# Patient Record
Sex: Female | Born: 1970 | Race: White | Hispanic: No | Smoking: Never smoker
Health system: Southern US, Community
[De-identification: ages and names within clinical notes are randomized; demographics above are authoritative.]

## PROBLEM LIST (undated history)

## (undated) DIAGNOSIS — R519 Headache, unspecified: Secondary | ICD-10-CM

## (undated) DIAGNOSIS — T7840XA Allergy, unspecified, initial encounter: Secondary | ICD-10-CM

## (undated) DIAGNOSIS — G43909 Migraine, unspecified, not intractable, without status migrainosus: Secondary | ICD-10-CM

## (undated) DIAGNOSIS — K5140 Inflammatory polyps of colon without complications: Secondary | ICD-10-CM

## (undated) HISTORY — DX: Allergy, unspecified, initial encounter: T78.40XA

## (undated) HISTORY — PX: OTHER SURGICAL HISTORY: SHX169

## (undated) HISTORY — DX: Headache, unspecified: R51.9

## (undated) HISTORY — DX: Inflammatory polyps of colon without complications: K51.40

## (undated) HISTORY — DX: Migraine, unspecified, not intractable, without status migrainosus: G43.909

---

## 1998-04-16 ENCOUNTER — Other Ambulatory Visit: Admission: RE | Admit: 1998-04-16 | Discharge: 1998-04-16 | Payer: Self-pay | Admitting: Obstetrics and Gynecology

## 1998-12-19 ENCOUNTER — Other Ambulatory Visit: Admission: RE | Admit: 1998-12-19 | Discharge: 1998-12-19 | Payer: Self-pay | Admitting: Obstetrics and Gynecology

## 1999-05-20 ENCOUNTER — Inpatient Hospital Stay (HOSPITAL_COMMUNITY): Admission: AD | Admit: 1999-05-20 | Discharge: 1999-05-20 | Payer: Self-pay | Admitting: Obstetrics and Gynecology

## 1999-07-05 ENCOUNTER — Encounter (INDEPENDENT_AMBULATORY_CARE_PROVIDER_SITE_OTHER): Payer: Self-pay | Admitting: Specialist

## 1999-07-05 ENCOUNTER — Inpatient Hospital Stay (HOSPITAL_COMMUNITY): Admission: AD | Admit: 1999-07-05 | Discharge: 1999-07-07 | Payer: Self-pay | Admitting: Obstetrics and Gynecology

## 1999-08-15 ENCOUNTER — Other Ambulatory Visit: Admission: RE | Admit: 1999-08-15 | Discharge: 1999-08-15 | Payer: Self-pay | Admitting: Obstetrics and Gynecology

## 2000-09-03 ENCOUNTER — Other Ambulatory Visit: Admission: RE | Admit: 2000-09-03 | Discharge: 2000-09-03 | Payer: Self-pay | Admitting: Obstetrics and Gynecology

## 2001-09-16 ENCOUNTER — Other Ambulatory Visit: Admission: RE | Admit: 2001-09-16 | Discharge: 2001-09-16 | Payer: Self-pay | Admitting: Obstetrics and Gynecology

## 2002-09-20 ENCOUNTER — Other Ambulatory Visit: Admission: RE | Admit: 2002-09-20 | Discharge: 2002-09-20 | Payer: Self-pay | Admitting: Obstetrics and Gynecology

## 2002-09-27 ENCOUNTER — Encounter: Payer: Self-pay | Admitting: Obstetrics and Gynecology

## 2002-09-27 ENCOUNTER — Encounter: Admission: RE | Admit: 2002-09-27 | Discharge: 2002-09-27 | Payer: Self-pay | Admitting: Obstetrics and Gynecology

## 2003-10-25 ENCOUNTER — Other Ambulatory Visit: Admission: RE | Admit: 2003-10-25 | Discharge: 2003-10-25 | Payer: Self-pay | Admitting: Obstetrics and Gynecology

## 2004-11-19 ENCOUNTER — Other Ambulatory Visit: Admission: RE | Admit: 2004-11-19 | Discharge: 2004-11-19 | Payer: Self-pay | Admitting: Obstetrics and Gynecology

## 2004-12-04 ENCOUNTER — Encounter: Admission: RE | Admit: 2004-12-04 | Discharge: 2004-12-04 | Payer: Self-pay | Admitting: Obstetrics and Gynecology

## 2008-07-12 ENCOUNTER — Encounter: Admission: RE | Admit: 2008-07-12 | Discharge: 2008-07-12 | Payer: Self-pay | Admitting: Obstetrics and Gynecology

## 2010-08-29 ENCOUNTER — Other Ambulatory Visit: Payer: Self-pay | Admitting: Obstetrics and Gynecology

## 2010-08-29 DIAGNOSIS — R928 Other abnormal and inconclusive findings on diagnostic imaging of breast: Secondary | ICD-10-CM

## 2010-09-05 ENCOUNTER — Ambulatory Visit
Admission: RE | Admit: 2010-09-05 | Discharge: 2010-09-05 | Disposition: A | Discharge status: Home / Self Care | Payer: BC Managed Care – PPO | Source: Ambulatory Visit | Attending: Obstetrics and Gynecology | Admitting: Obstetrics and Gynecology

## 2010-09-05 DIAGNOSIS — R928 Other abnormal and inconclusive findings on diagnostic imaging of breast: Secondary | ICD-10-CM

## 2014-09-17 ENCOUNTER — Encounter: Payer: Self-pay | Admitting: *Deleted

## 2014-09-17 ENCOUNTER — Other Ambulatory Visit: Payer: Self-pay

## 2014-09-17 ENCOUNTER — Emergency Department
Admission: EM | Admit: 2014-09-17 | Discharge: 2014-09-17 | Disposition: A | Discharge status: Home / Self Care | Payer: BC Managed Care – PPO | Attending: Emergency Medicine | Admitting: Emergency Medicine

## 2014-09-17 ENCOUNTER — Emergency Department: Payer: BC Managed Care – PPO

## 2014-09-17 DIAGNOSIS — R202 Paresthesia of skin: Secondary | ICD-10-CM | POA: Diagnosis not present

## 2014-09-17 DIAGNOSIS — G43909 Migraine, unspecified, not intractable, without status migrainosus: Secondary | ICD-10-CM | POA: Diagnosis not present

## 2014-09-17 DIAGNOSIS — Z3202 Encounter for pregnancy test, result negative: Secondary | ICD-10-CM | POA: Insufficient documentation

## 2014-09-17 DIAGNOSIS — R002 Palpitations: Secondary | ICD-10-CM

## 2014-09-17 DIAGNOSIS — R0789 Other chest pain: Secondary | ICD-10-CM | POA: Diagnosis not present

## 2014-09-17 LAB — BASIC METABOLIC PANEL
ANION GAP: 12 (ref 5–15)
BUN: 9 mg/dL (ref 6–20)
CHLORIDE: 96 mmol/L — AB (ref 101–111)
CO2: 23 mmol/L (ref 22–32)
CREATININE: 0.73 mg/dL (ref 0.44–1.00)
Calcium: 9.4 mg/dL (ref 8.9–10.3)
GLUCOSE: 116 mg/dL — AB (ref 65–99)
Potassium: 3.4 mmol/L — ABNORMAL LOW (ref 3.5–5.1)
SODIUM: 131 mmol/L — AB (ref 135–145)

## 2014-09-17 LAB — CBC
HCT: 41.4 % (ref 35.0–47.0)
Hemoglobin: 14.1 g/dL (ref 12.0–16.0)
MCH: 30.3 pg (ref 26.0–34.0)
MCHC: 34.1 g/dL (ref 32.0–36.0)
MCV: 89.1 fL (ref 80.0–100.0)
PLATELETS: 308 10*3/uL (ref 150–440)
RBC: 4.65 MIL/uL (ref 3.80–5.20)
RDW: 12.7 % (ref 11.5–14.5)
WBC: 11.2 10*3/uL — ABNORMAL HIGH (ref 3.6–11.0)

## 2014-09-17 LAB — POCT PREGNANCY, URINE: PREG TEST UR: NEGATIVE

## 2014-09-17 LAB — TROPONIN I

## 2014-09-17 NOTE — ED Notes (Signed)
Pt states that she has been having chest pressure, tightness and right sided arm pain. Pt states that she also has a headache and is feeling very anxious.  Pt states that she worked outside and that she "did over do it" .  Pt states that she drank one beer today. Pt in NAD a this time, pt was ambulatory to treatment room and has stable vitals.

## 2014-09-17 NOTE — ED Provider Notes (Signed)
[facility] Emergency Department Provider Note  Time seen: 10:58 PM  I have reviewed the triage vital signs and the nursing notes.   HISTORY  Chief Complaint Palpitations    HPI Hailey Williams is a 44 y.o. female with a past medical history of migraines presents the emergency department with symptoms of fatigue, weakness, migraine headache, chest tightness, palpitations, arm tingling. According to the patient she states she was doing a lot of yard work today in the heat. Since around 2-3 PM the patient has been feeling weak, like she "overdid it". Around dinnertime tonight the patient was feeling palpitations, developed a migraine headache with nausea, one episode of vomiting, and felt some chest tightness. She states after the episode of vomiting she felt considerably better but the headache continued. The patient states she gets migraine headaches very frequently which she takes medicine at home for. Patient denies any chest pain currently upon arrival to the ER. She does state tingling in the right arm and left hand. States continued migraine which she ranks a 7/10. Along with photophobia/phonophobia.She states this feels like her typical migraine.     No past medical history on file.  There are no active problems to display for this patient.   No past surgical history on file.  No current outpatient prescriptions on file.  Allergies Review of patient's allergies indicates no known allergies.  No family history on file.  Social History History  Substance Use Topics  . Smoking status: Not on file  . Smokeless tobacco: Not on file  . Alcohol Use: Not on file    Review of Systems Constitutional: Negative for fever. Eyes: Positive photophobia Cardiovascular: Positive chest tightness, now resolved Respiratory: Mild shortness of breath, now resolved Gastrointestinal: Negative for abdominal pain. One episode of nausea/vomiting, now  resolved Genitourinary: Negative for dysuria. Skin: Negative for rash. Neurological: Positive for migraine headache. Bilateral hand tingling with some right arm tingling/numbness. 10-point ROS otherwise negative.  ____________________________________________   PHYSICAL EXAM:  VITAL SIGNS: ED Triage Vitals  Enc Vitals Group     BP 09/17/14 2117 148/100 mmHg     Pulse Rate 09/17/14 2117 105     Resp 09/17/14 2117 18     Temp 09/17/14 2117 97.9 F (36.6 C)     Temp Source 09/17/14 2117 Oral     SpO2 09/17/14 2117 100 %     Weight 09/17/14 2117 175 lb (79.379 kg)     Height 09/17/14 2117  (1.753 m)     Head Cir --      Peak Flow --      Pain Score 09/17/14 2249 7     Pain Loc --      Pain Edu? --      Excl. in GC? --     Constitutional: Alert and oriented. Laying in bed with her eyes closed, saying she has a migraine headache. Eyes: Normal exam, 3 mm PERRL, moderate photophobia ENT   Head: Normocephalic and atraumatic.   Mouth/Throat: Mucous membranes are moist. Cardiovascular: Normal rate, regular rhythm. No murmurs, no ectopic beats. Respiratory: Normal respiratory effort without tachypnea nor retractions. Breath sounds are clear Gastrointestinal: Soft and nontender. No distention.   Musculoskeletal: Nontender with normal range of motion in all extremities. N Neurologic:  Normal speech and language. No gross focal neurologic deficits are appreciated. Speech is normal. Skin:  Skin is warm, dry and intact.  Psychiatric: Mood and affect are normal. Speech and behavior are normal.  ____________________________________________    EKG  EKG reviewed and interpreted by myself shows sinus tachycardia 101 bpm, narrow QRS, normal axis, normal intervals. No ST changes noted.  ____________________________________________  INITIAL IMPRESSION / ASSESSMENT AND PLAN / ED COURSE  Pertinent labs & imaging results that were available during my care of the patient were  reviewed by me and considered in my medical decision making (see chart for details).  Patient with vague symptoms, but most importantly with a headache and some right arm and left hand tingling, and palpitations/chest pain which is now resolved. This is labs show largely normal results at this time. EKG is normal. I discussed with the patient my plan to treat her migraine, monitor in the emergency department and repeat a troponin on the patient to ensure that her heart is okay. Patient states she feels like a normal migraine at this time, she really wishes to go home and try to sleep off the migraine. I discussed with the patient very strict return precautions, which the patient is agreeable and we will discharge her home.  ____________________________________________   FINAL CLINICAL IMPRESSION(S) / ED DIAGNOSES  Migraine headache Palpitations   Minna AntisKevin Doroteo Nickolson, MD 09/17/14 [phone]

## 2014-09-17 NOTE — ED Notes (Signed)
Neg preg test

## 2014-09-17 NOTE — Discharge Instructions (Signed)
Migraine Headache  A migraine headache is an intense, throbbing pain on one or both sides of your head. A migraine can last for 30 minutes to several hours.  CAUSES   The exact cause of a migraine headache is not always known. However, a migraine may be caused when nerves in the brain become irritated and release chemicals that cause inflammation. This causes pain.  Certain things may also trigger migraines, such as:   Alcohol.   Smoking.   Stress.   Menstruation.   Aged cheeses.   Foods or drinks that contain nitrates, glutamate, aspartame, or tyramine.   Lack of sleep.   Chocolate.   Caffeine.   Hunger.   Physical exertion.   Fatigue.   Medicines used to treat chest pain (nitroglycerine), birth control pills, estrogen, and some blood pressure medicines.  SIGNS AND SYMPTOMS   Pain on one or both sides of your head.   Pulsating or throbbing pain.   Severe pain that prevents daily activities.   Pain that is aggravated by any physical activity.   Nausea, vomiting, or both.   Dizziness.   Pain with exposure to bright lights, loud noises, or activity.   General sensitivity to bright lights, loud noises, or smells.  Before you get a migraine, you may get warning signs that a migraine is coming (aura). An aura may include:   Seeing flashing lights.   Seeing bright spots, halos, or zigzag lines.   Having tunnel vision or blurred vision.   Having feelings of numbness or tingling.   Having trouble talking.   Having muscle weakness.  DIAGNOSIS   A migraine headache is often diagnosed based on:   Symptoms.   Physical exam.   A CT scan or MRI of your head. These imaging tests cannot diagnose migraines, but they can help rule out other causes of headaches.  TREATMENT  Medicines may be given for pain and nausea. Medicines can also be given to help prevent recurrent migraines.   HOME CARE INSTRUCTIONS   Only take over-the-counter or prescription medicines for pain or discomfort as directed by your  health care provider. The use of long-term narcotics is not recommended.   Lie down in a dark, quiet room when you have a migraine.   Keep a journal to find out what may trigger your migraine headaches. For example, write down:   What you eat and drink.   How much sleep you get.   Any change to your diet or medicines.   Limit alcohol consumption.   Quit smoking if you smoke.   Get 7-9 hours of sleep, or as recommended by your health care provider.   Limit stress.   Keep lights dim if bright lights bother you and make your migraines worse.  SEEK IMMEDIATE MEDICAL CARE IF:    Your migraine becomes severe.   You have a fever.   You have a stiff neck.   You have vision loss.   You have muscular weakness or loss of muscle control.   You start losing your balance or have trouble walking.   You feel faint or pass out.   You have severe symptoms that are different from your first symptoms.  MAKE SURE YOU:    Understand these instructions.   Will watch your condition.   Will get help right away if you are not doing well or get worse.  Document Released: 04/06/2005 Document Revised: 08/21/2013 Document Reviewed: 12/12/2012  ExitCare Patient Information 2015 [employer]. This information   is not intended to replace advice given to you by your health care provider. Make sure you discuss any questions you have with your health care provider.    Palpitations  A palpitation is the feeling that your heartbeat is irregular or is faster than normal. It may feel like your heart is fluttering or skipping a beat. Palpitations are usually not a serious problem. However, in some cases, you may need further medical evaluation.  CAUSES   Palpitations can be caused by:   Smoking.   Caffeine or other stimulants, such as diet pills or energy drinks.   Alcohol.   Stress and anxiety.   Strenuous physical activity.   Fatigue.   Certain medicines.   Heart disease, especially if you have a history of irregular heart  rhythms (arrhythmias), such as atrial fibrillation, atrial flutter, or supraventricular tachycardia.   An improperly working pacemaker or defibrillator.  DIAGNOSIS   To find the cause of your palpitations, your health care provider will take your medical history and perform a physical exam. Your health care provider may also have you take a test called an ambulatory electrocardiogram (ECG). An ECG records your heartbeat patterns over a 24-hour period. You may also have other tests, such as:   Transthoracic echocardiogram (TTE). During echocardiography, sound waves are used to evaluate how blood flows through your heart.   Transesophageal echocardiogram (TEE).   Cardiac monitoring. This allows your health care provider to monitor your heart rate and rhythm in real time.   Holter monitor. This is a portable device that records your heartbeat and can help diagnose heart arrhythmias. It allows your health care provider to track your heart activity for several days, if needed.   Stress tests by exercise or by giving medicine that makes the heart beat faster.  TREATMENT   Treatment of palpitations depends on the cause of your symptoms and can vary greatly. Most cases of palpitations do not require any treatment other than time, relaxation, and monitoring your symptoms. Other causes, such as atrial fibrillation, atrial flutter, or supraventricular tachycardia, usually require further treatment.  HOME CARE INSTRUCTIONS    Avoid:   Caffeinated coffee, tea, soft drinks, diet pills, and energy drinks.   Chocolate.   Alcohol.   Stop smoking if you smoke.   Reduce your stress and anxiety. Things that can help you relax include:   A method of controlling things in your body, such as your heartbeats, with your mind (biofeedback).   Yoga.   Meditation.   Physical activity such as swimming, jogging, or walking.   Get plenty of rest and sleep.  SEEK MEDICAL CARE IF:    You continue to have a fast or irregular  heartbeat beyond 24 hours.   Your palpitations occur more often.  SEEK IMMEDIATE MEDICAL CARE IF:   You have chest pain or shortness of breath.   You have a severe headache.   You feel dizzy or you faint.  MAKE SURE YOU:   Understand these instructions.   Will watch your condition.   Will get help right away if you are not doing well or get worse.  Document Released: 04/03/2000 Document Revised: 04/11/2013 Document Reviewed: 06/05/2011  ExitCare Patient Information 2015 [employer]. This information is not intended to replace advice given to you by your health care provider. Make sure you discuss any questions you have with your health care provider.

## 2014-09-17 NOTE — ED Notes (Signed)
Pt states that she took 81mg  ASA PTA

## 2018-06-10 ENCOUNTER — Ambulatory Visit: Payer: Self-pay | Admitting: Physician Assistant

## 2018-06-10 VITALS — BP 138/90 | HR 84 | Temp 98.8°F | Resp 16 | Wt 193.0 lb

## 2018-06-10 DIAGNOSIS — J22 Unspecified acute lower respiratory infection: Secondary | ICD-10-CM

## 2018-06-10 DIAGNOSIS — R0981 Nasal congestion: Secondary | ICD-10-CM

## 2018-06-10 DIAGNOSIS — R0982 Postnasal drip: Secondary | ICD-10-CM

## 2018-06-10 MED ORDER — PREDNISONE 50 MG PO TABS: 50.0000 mg | ORAL_TABLET | Freq: Every day | ORAL | 0 refills | Status: AC

## 2018-06-10 MED ORDER — PSEUDOEPH-BROMPHEN-DM 30-2-10 MG/5ML PO SYRP: 5.0000 mL | ORAL_SOLUTION | Freq: Four times a day (QID) | ORAL | 0 refills | Status: DC | PRN

## 2018-06-10 MED ORDER — DOXYCYCLINE HYCLATE 100 MG PO TABS: 100.0000 mg | ORAL_TABLET | Freq: Two times a day (BID) | ORAL | 0 refills | Status: AC

## 2018-06-10 NOTE — Progress Notes (Signed)
Patient ID: Hailey Williams DOB: 05-18-1970 AGE: 48 y.o. MRN: [id number]   PCP: No primary care provider on file.   Chief Complaint:  Chief Complaint  Patient presents with  . Nasal Congestion    x1wk  . Hoarse    x1wk  . Fever    on Sun & mon     Subjective:    HPI:  STEPHENIE Williams is a 48 y.o. female presents for evaluation  Chief Complaint  Patient presents with  . Nasal Congestion    x1wk  . Hoarse    x1wk  . Fever    on Sun & mon   48 year old female presents to [facility] with 8 day history of URI symptoms. Began with body aches and sore throat. Then developed hoarse voice/laryngitis, rhinorrhea, nasal congestion, postnasal drip and cough. Day 2-4 of symptoms had fever; tmax 101F, controlled with ibuprofen. Felt better for 1-2 days. Then developed worsening cough. Coarse, junky, barking. Causing difficulty sleeping. Triggered a migraine; patient with diagnosis of migraines, managed by Ob/Gyn, has Relpax prn, took one Relpax yesterday evening. Reports return of fatigue and body aches. Has taken OTC Mucinex, Dayquil, and Nyquil with minimal relief. Using Afrin nasal spray. Patient on Zyrtec daily for past 6 weeks due to seasonal allergies; most severe in Spring/Summer. Denies dizziness/lightheadedness, ear pain, sinus pain, chest pain, SOB, wheezing, nausea/vomiting (though, does have anorexia), diarrhea, rash.  Patient did receive this season's influenza vaccination. No known specific flu exposure. Patient's father recently died; planning wake and funeral for this weekend, patient admits to being stressed and not sleeping well.  Denies smoking history. No asthma history. In 2010, ten years ago, patient had influenza that then turned in to severe bronchitis. Was treated empirically for CAP, no CXR performed.  A limited review of symptoms was performed, pertinent positives and negatives as mentioned in HPI.  The following portions of the patient's history  were reviewed and updated as appropriate: allergies, current medications and past medical history.  There are no active problems to display for this patient.   No Known Allergies  Current Outpatient Medications on File Prior to Visit  Medication Sig Dispense Refill  . CRYSELLE-28 0.3-30 MG-MCG tablet     . eletriptan (RELPAX) 40 MG tablet      No current facility-administered medications on file prior to visit.        Objective:   Vitals:   06/10/18 0823  BP: 138/90  Pulse: 84  Resp: 16  Temp: 98.8 F (37.1 C)  SpO2: 95%     Wt Readings from Last 3 Encounters:  06/10/18 193 lb (87.5 kg)  09/17/14 175 lb (79.4 kg)    Physical Exam:   General Appearance:  Patient sitting comfortably on examination table. Conversational. Peri [place] self-historian. In no acute distress. Afebrile.   Head:  Normocephalic, without obvious abnormality, atraumatic  Eyes:  PERRL, conjunctiva/corneas clear, EOM's intact  Ears:  Bilateral ear canals WNL. No erythema or edema. No discharge/drainage. Bilateral TMs WNL. No erythema, injection, or serous effusion. No scar tissue.  Nose: Nares normal, septum midline. Nasal mucosa with bilateral edema, faint erythema, and scant clear rhinorrhea. Nasally sounding voice. No sinus tenderness with percussion/palpation.  Throat: Lips, mucosa, and tongue normal; teeth and gums normal. Throat reveals moderate erythema and visible postnasal drip. Tonsils with no enlargement or exudate. Hoarse voice. No hot potato/muffled voice. No trismus. No grimacing with swallowing. No drooling. No stridor.  Neck: Supple, symmetrical, trachea midline, bilateral anterior  cervical lymphadenopathy (tender to palpation)  Lungs:   Clear to auscultation bilaterally, respirations unlabored. Good aeration. No rales, rhonchi, crackles or wheezing, even with deep inspiration and forced expiration. Coarse/junky cough during examination; barking/seal cough triggered with deep inspiration.  Heart:   Regular rate and rhythm, S1 and S2 normal, no murmur, rub, or gallop  Extremities: Extremities normal, atraumatic, no cyanosis or edema  Pulses: 2+ and symmetric  Skin: Skin color, texture, turgor normal, no rashes or lesions  Lymph nodes: Cervical, supraclavicular, and axillary nodes normal  Neurologic: Normal    Assessment & Plan:    Exam findings, diagnosis etiology and medication use and indications reviewed with patient. Follow-Up and discharge instructions provided. No emergent/urgent issues found on exam.  Patient education was provided.   Patient verbalized understanding of information provided and agrees with plan of care (POC), all questions answered. The patient is advised to call or return to clinic if condition does not see an improvement in symptoms, or to seek the care of the closest emergency department if condition worsens with the below plan.    1. Lower respiratory infection (e.g., bronchitis, pneumonia, pneumonitis, pulmonitis) - doxycycline (VIBRA-TABS) 100 MG tablet; Take 1 tablet (100 mg total) by mouth 2 (two) times daily for 7 days.  Dispense: 14 tablet; Refill: 0 - predniSONE (DELTASONE) 50 MG tablet; Take 1 tablet (50 mg total) by mouth daily with breakfast for 5 days.  Dispense: 5 tablet; Refill: 0 - brompheniramine-pseudoephedrine-DM 30-2-10 MG/5ML syrup; Take 5 mLs by mouth 4 (four) times daily as needed.  Dispense: 120 mL; Refill: 0  2. Nasal congestion  3. Postnasal drip  Patient with 8 day history of URI symptoms, that seem to have developed in to lower respiratory infection (bacterial bronchitis or CAP). Double sickness, return of constitutional symptoms (malaise, fatigue, body aches), and 95% pulse ox (unlikely per patient's normal, with no underlying pulmonary compromise). At this time, feel an antibiotic covering for CAP is warranted; prescribed 7-day course of Doxycycline 100mg  bid. Prescribed prednisone blast (50mg  qd x 5 days) and Bromphed cough  syrup for symptom relief. Patient's VSS, afebrile, in no acute distress, clear lung sounds - believe patient can be treated safely outpatient. Discussed with patient, if no improvement in 4-5 days, sooner with worsening symptoms, and sooner if return of fever - patient should follow-up with PCP, urgent care, or ED for further evaluation and treatment. Patient agreed with plan.  Discussed with patient, medications prescribed are considered teratogenic. Patient denied any possibility of pregnancy. On oral contraceptives for perimenopausal symptoms. Offered urine pregnancy test, patient declined.   Janalyn Harder, MHS, PA-C Rulon Sera, MHS, PA-C Advanced Practice Provider [facility]  [address] [facility], [address] [place], [place] [address] (p):  [phone] [email or web address]

## 2018-06-10 NOTE — Patient Instructions (Addendum)
Thank you for choosing [facility] for your health care needs.  You have been diagnosed with a lower respiratory tract infection (bronchitis and/or pneumonia).  Take prescription medication as prescribed: Meds ordered this encounter  Medications  . doxycycline (VIBRA-TABS) 100 MG tablet    Sig: Take 1 tablet (100 mg total) by mouth 2 (two) times daily for 7 days.    Dispense:  14 tablet    Refill:  0    Order Specific Question:   Supervising Provider    Answer:   MILLER, BRIAN [[id number]]  . predniSONE (DELTASONE) 50 MG tablet    Sig: Take 1 tablet (50 mg total) by mouth daily with breakfast for 5 days.    Dispense:  5 tablet    Refill:  0    Order Specific Question:   Supervising Provider    Answer:   MILLER, BRIAN [[id number]]  . brompheniramine-pseudoephedrine-DM 30-2-10 MG/5ML syrup    Sig: Take 5 mLs by mouth 4 (four) times daily as needed.    Dispense:  120 mL    Refill:  0    Order Specific Question:   Supervising Provider    Answer:   MILLER, BRIAN [[id number]]   Discontinue use of Afrin nasal spray. May use Flonase nasal spray or saline nasal spray.  Increase fluids. Rest. Use cool mist humidifier in bedroom at night. Prop self up with pillows or sleep in a recliner at night.  Follow-up with family physician or at an urgent care in 4-5 days if not improving, sooner with any worsening symptoms.  Community-Acquired Pneumonia, Adult Pneumonia is an infection of the lungs. It causes swelling in the airways of the lungs. Mucus and fluid may also build up inside the airways. One type of pneumonia can happen while a person is in a hospital. A different type can happen when a person is not in a hospital (community-acquired pneumonia).  What are the causes?  This condition is caused by germs (viruses, bacteria, or fungi). Some types of germs can be passed from one person to another. This can happen when you breathe in droplets from the cough or sneeze of an infected person. What increases the  risk? You are more likely to develop this condition if you:  Have a long-term (chronic) disease, such as: ? Chronic obstructive pulmonary disease (COPD). ? Asthma. ? Cystic fibrosis. ? Congestive heart failure. ? Diabetes. ? Kidney disease.  Have HIV.  Have sickle cell disease.  Have had your spleen removed.  Do not take good care of your teeth and mouth (poor dental hygiene).  Have a medical condition that increases the risk of breathing in droplets from your own mouth and nose.  Have a weakened body defense system (immune system).  Are a smoker.  Travel to areas where the germs that cause this illness are common.  Are around certain animals or the places they live. What are the signs or symptoms?  A dry cough.  A wet (productive) cough.  Fever.  Sweating.  Chest pain. This often happens when breathing deeply or coughing.  Fast breathing or trouble breathing.  Shortness of breath.  Shaking chills.  Feeling tired (fatigue).  Muscle aches. How is this treated? Treatment for this condition depends on many things. Most adults can be treated at home. In some cases, treatment must happen in a hospital. Treatment may include:  Medicines given by mouth or through an IV tube.  Being given extra oxygen.  Respiratory therapy. In rare cases, treatment for very  bad pneumonia may include:  Using a machine to help you breathe.  Having a procedure to remove fluid from around your lungs. Follow these instructions at home: Medicines  Take over-the-counter and prescription medicines only as told by your doctor. ? Only take cough medicine if you are losing sleep.  If you were prescribed an antibiotic medicine, take it as told by your doctor. Do not stop taking the antibiotic even if you start to feel better. General instructions   Sleep with your head and neck raised (elevated). You can do this by sleeping in a recliner or by putting a few pillows under your  head.  Rest as needed. Get at least 8 hours of sleep each night.  Drink enough water to keep your pee (urine) pale yellow.  Eat a healthy diet that includes plenty of vegetables, fruits, whole grains, low-fat dairy products, and lean protein.  Do not use any products that contain nicotine or tobacco. These include cigarettes, e-cigarettes, and chewing tobacco. If you need help quitting, ask your doctor.  Keep all follow-up visits as told by your doctor. This is important. How is this prevented? A shot (vaccine) can help prevent pneumonia. Shots are often suggested for:  People older than 50 years of age.  People older than 50 years of age who: ? Are having cancer treatment. ? Have long-term (chronic) lung disease. ? Have problems with their body's defense system. You may also prevent pneumonia if you take these actions:  Get the flu (influenza) shot every year.  Go to the dentist as often as told.  Wash your hands often. If you cannot use soap and water, use hand sanitizer. Contact a doctor if:  You have a fever.  You lose sleep because your cough medicine does not help. Get help right away if:  You are short of breath and it gets worse.  You have more chest pain.  Your sickness gets worse. This is very serious if: ? You are an older adult. ? Your body's defense system is weak.  You cough up blood. Summary  Pneumonia is an infection of the lungs.  Most adults can be treated at home. Some will need treatment in a hospital.  Drink enough water to keep your pee pale yellow.  Get at least 8 hours of sleep each night. This information is not intended to replace advice given to you by your health care provider. Make sure you discuss any questions you have with your health care provider. Document Released: 09/23/2007 Document Revised: 12/02/2017 Document Reviewed: 12/02/2017 Elsevier Interactive Patient Education  2019 [employer].

## 2018-06-15 ENCOUNTER — Telehealth: Payer: Self-pay | Admitting: Emergency Medicine

## 2018-06-15 NOTE — Telephone Encounter (Signed)
Left message following up on visit with [facility] 

## 2018-09-02 ENCOUNTER — Other Ambulatory Visit: Payer: Self-pay | Admitting: Obstetrics and Gynecology

## 2018-09-02 DIAGNOSIS — Z9189 Other specified personal risk factors, not elsewhere classified: Secondary | ICD-10-CM

## 2018-09-02 DIAGNOSIS — Z803 Family history of malignant neoplasm of breast: Secondary | ICD-10-CM

## 2018-09-26 ENCOUNTER — Other Ambulatory Visit: Payer: BC Managed Care – PPO

## 2019-09-13 ENCOUNTER — Other Ambulatory Visit: Payer: Self-pay | Admitting: Obstetrics and Gynecology

## 2019-09-13 DIAGNOSIS — Z9189 Other specified personal risk factors, not elsewhere classified: Secondary | ICD-10-CM

## 2019-11-01 ENCOUNTER — Other Ambulatory Visit: Payer: BC Managed Care – PPO

## 2019-11-10 ENCOUNTER — Other Ambulatory Visit: Payer: BC Managed Care – PPO

## 2020-01-19 DIAGNOSIS — R748 Abnormal levels of other serum enzymes: Secondary | ICD-10-CM

## 2020-01-19 HISTORY — DX: Abnormal levels of other serum enzymes: R74.8

## 2020-02-06 ENCOUNTER — Ambulatory Visit: Payer: BC Managed Care – PPO | Admitting: Nurse Practitioner

## 2020-02-12 ENCOUNTER — Other Ambulatory Visit: Payer: Self-pay

## 2020-02-13 ENCOUNTER — Ambulatory Visit (INDEPENDENT_AMBULATORY_CARE_PROVIDER_SITE_OTHER): Payer: BC Managed Care – PPO | Admitting: Nurse Practitioner

## 2020-02-13 ENCOUNTER — Encounter: Payer: Self-pay | Admitting: Nurse Practitioner

## 2020-02-13 VITALS — BP 130/80 | HR 88 | Temp 98.3°F | Ht 70.0 in | Wt 171.0 lb

## 2020-02-13 DIAGNOSIS — R7989 Other specified abnormal findings of blood chemistry: Secondary | ICD-10-CM | POA: Diagnosis not present

## 2020-02-13 DIAGNOSIS — K824 Cholesterolosis of gallbladder: Secondary | ICD-10-CM

## 2020-02-13 DIAGNOSIS — M25511 Pain in right shoulder: Secondary | ICD-10-CM | POA: Diagnosis not present

## 2020-02-13 DIAGNOSIS — Z0000 Encounter for general adult medical examination without abnormal findings: Secondary | ICD-10-CM

## 2020-02-13 NOTE — Progress Notes (Addendum)
Established Patient Office Visit  Subjective:  Patient ID: Hailey Williams, female    DOB: September 25, 1970  Age: 49 y.o. MRN: [id number]  CC:  Chief Complaint  Patient presents with  . New Patient (Initial Visit)    establish care    HPI Hailey Williams is a 49 year old who presents to establish care with primary care provider.  She reports her gynecologist set this up for her to check elevated liver enzymes.     Elevated LFTS: ALT 74, AST 34 per patient's my chart record.  She has no abdominal pain complaints, history of alcohol or drug use.  And though recently this is the first time she has been out aware of elevated liver enzymes.  She takes Zyrtec Replax-a triptan for migraine headaches as needed.  Right shoulder pain: She would also like referral to orthopedics for intermittent right shoulder pain.  She has had no specific injury.  Her chiropractor performed a maneuver and snapped her right shoulder into place.  It resolved her pain for 24 hours and then it returns, but only when she raises her arm to the side.    Immunizations: Tdap- unknown. Moderna Covid 06/15/19 and 07/14/19.  Diet: working on it Exercise: no formal -  Colonoscopy:Last month- first colon polyp- small- repeat in 3 years for inflammatory polyp Dexa:  2020- for screening Pap Smear: Annual GYN Physician for women 'sat [place]- Dr. Radene Knee Mammogram: Annual GYN- advised to have MRI in place of mammo- too expensive SKIN: Severely atypical mole- needs annual exam' Dental:[facility]: needs done   Past Medical History:  Diagnosis Date  . Allergy   . Elevated liver enzymes 01/2020  . Frequent headaches   . Inflammatory polyps of colon ([place])   . Migraines     Past Surgical History:  Procedure Laterality Date  . nevi     left thoracic mole removal severly atypical     Family History  Problem Relation Age of Onset  . Cancer Mother   . Cancer Father   . Arthritis Father   . Hypertension Father   .  Hypertension Brother   . Hypertension Brother     Social History   Socioeconomic History  . Marital status: Married    Spouse name: Not on file  . Number of children: Not on file  . Years of education: Not on file  . Highest education level: Bachelor's degree (e.g., BA, AB, BS)  Occupational History  . Not on file  Tobacco Use  . Smoking status: Never Smoker  . Smokeless tobacco: Never Used  Vaping Use  . Vaping Use: Never used  Substance and Sexual Activity  . Alcohol use: Yes    Comment: 1 drink a week.- light social past and present.   . Drug use: No  . Sexual activity: Yes    Partners: Male  Other Topics Concern  . Not on file  Social History Narrative   Music therapist at [employer]   Has a long term fiance and has 3 adult children   Social Determinants of Health   Financial Resource Strain:   . Difficulty of Paying Living Expenses: Not on file  Food Insecurity:   . Worried About Charity fundraiser in the Last Year: Not on file  . Ran Out of Food in the Last Year: Not on file  Transportation Needs:   . Lack of Transportation (Medical): Not on file  . Lack of Transportation (Non-Medical): Not on file  Physical  Activity:   . Days of Exercise per Week: Not on file  . Minutes of Exercise per Session: Not on file  Stress:   . Feeling of Stress : Not on file  Social Connections:   . Frequency of Communication with Friends and Family: Not on file  . Frequency of Social Gatherings with Friends and Family: Not on file  . Attends Religious Services: Not on file  . Active Member of Clubs or Organizations: Not on file  . Attends Archivist Meetings: Not on file  . Marital Status: Not on file  Intimate Partner Violence:   . Fear of Current or Ex-Partner: Not on file  . Emotionally Abused: Not on file  . Physically Abused: Not on file  . Sexually Abused: Not on file    Outpatient Medications Prior to Visit  Medication Sig Dispense Refill  .  CRYSELLE-28 0.3-30 MG-MCG tablet     . eletriptan (RELPAX) 40 MG tablet     . Multiple Vitamins-Minerals (MULTIVITAMIN ADULTS 50+ PO) Take by mouth.    . Probiotic Product (PROBIOTIC ADVANCED PO) Take by mouth.    . Turmeric 400 MG CAPS Take by mouth.    . brompheniramine-pseudoephedrine-DM 30-2-10 MG/5ML syrup Take 5 mLs by mouth 4 (four) times daily as needed. 120 mL 0   No facility-administered medications prior to visit.    No Known Allergies  Review of Systems  Constitutional: Negative.   HENT: Negative.   Eyes: Negative.   Respiratory: Negative.   Cardiovascular: Negative.   Gastrointestinal: Negative.   Endocrine: Negative.   Genitourinary: Negative.   Musculoskeletal:       Right shoulder pain  Allergic/Immunologic: Positive for environmental allergies.       Zrytec  Neurological: Negative for dizziness, seizures, light-headedness and headaches.  Hematological: Negative.   Psychiatric/Behavioral: Negative.       Objective:    Physical Exam Vitals reviewed.  Constitutional:      Appearance: She is normal weight.  HENT:     Head: Normocephalic and atraumatic.  Eyes:     Conjunctiva/sclera: Conjunctivae normal.  Cardiovascular:     Rate and Rhythm: Normal rate and regular rhythm.     Pulses: Normal pulses.     Heart sounds: Normal heart sounds.  Pulmonary:     Effort: Pulmonary effort is normal.     Breath sounds: Normal breath sounds.  Abdominal:     Palpations: Abdomen is soft.     Tenderness: There is no abdominal tenderness.  Musculoskeletal:        General: Normal range of motion.     Cervical back: Normal range of motion and neck supple.     Comments: Pain with right abduction, but good ROM. Non tender AC.No bony deformity.    Skin:    General: Skin is warm and dry.  Neurological:     General: No focal deficit present.     Mental Status: She is alert and oriented to person, place, and time.  Psychiatric:        Mood and Affect: Mood normal.         Behavior: Behavior normal.     BP 130/80 (BP Location: Left Arm, Patient Position: Sitting, Cuff Size: Normal)   Pulse 88   Temp 98.3 F (36.8 C) (Oral)   Ht $R'5\' 10"'Iu$  (1.778 m)   Wt 171 lb (77.6 kg)   SpO2 97%   BMI 24.54 kg/m  Wt Readings from Last 3 Encounters:  02/13/20 171 lb (  77.6 kg)  06/10/18 193 lb (87.5 kg)  09/17/14 175 lb (79.4 kg)   Pulse Readings from Last 3 Encounters:  02/13/20 88  06/10/18 84  09/17/14 99    BP Readings from Last 3 Encounters:  02/13/20 130/80  06/10/18 138/90  09/17/14 (!) 150/94    Lab Results  Component Value Date   CHOL 179 02/13/2020   HDL 73.90 02/13/2020   LDLCALC 91 02/13/2020   TRIG 70.0 02/13/2020   CHOLHDL 2 02/13/2020      Health Maintenance Due  Topic Date Due  . TETANUS/TDAP  Never done    There are no preventive care reminders to display for this patient.  Lab Results  Component Value Date   TSH 1.17 02/13/2020   Lab Results  Component Value Date   WBC 7.1 02/13/2020   HGB 15.0 02/13/2020   HCT 44.4 02/13/2020   MCV 90.8 02/13/2020   PLT 238.0 02/13/2020   Lab Results  Component Value Date   NA 138 02/13/2020   K 4.1 02/13/2020   CO2 31 02/13/2020   GLUCOSE 73 02/13/2020   BUN 16 02/13/2020   CREATININE 0.60 02/13/2020   BILITOT 0.5 02/13/2020   ALKPHOS 87 02/13/2020   AST 25 02/13/2020   ALT 39 (H) 02/13/2020   PROT 7.1 02/13/2020   ALBUMIN 4.6 02/13/2020   CALCIUM 9.9 02/13/2020   ANIONGAP 12 09/17/2014   GFR 103.64 02/13/2020   Lab Results  Component Value Date   CHOL 179 02/13/2020   Lab Results  Component Value Date   HDL 73.90 02/13/2020   Lab Results  Component Value Date   LDLCALC 91 02/13/2020   Lab Results  Component Value Date   TRIG 70.0 02/13/2020   Lab Results  Component Value Date   CHOLHDL 2 02/13/2020   Lab Results  Component Value Date   HGBA1C 5.5 02/13/2020      Assessment & Plan:   Problem List Items Addressed This Visit      Other    Elevated LFTs - Primary   Relevant Orders   Comprehensive metabolic panel (Completed)   Hemoglobin A1c (Completed)   HIV Antibody (routine testing w rflx) (Completed)   Lipid panel (Completed)   US Abdomen Complete   Acute Hep Panel & Hep B Surface Ab (Completed)   ANA w/Reflex if Positive (Completed)   Iron, TIBC and Ferritin Panel (Completed)   Anti-smooth muscle antibody, IgG (Completed)   Protime-INR (Completed)   Acute pain of right shoulder   Relevant Orders   Ambulatory referral to Orthopedic Surgery   Encounter for medical examination to establish care   Relevant Orders   CBC with Differential/Platelet (Completed)   TSH (Completed)      No orders of the defined types were placed in this encounter. Return  to the lab today.  Ordered abdominal ultrasound to check for liver.  You should be getting a call from scheduling to set this up.  I placed referral to [facility] for your right shoulder pain with movement.  Follow-up office visit in 2 weeks after the ultrasound and labs to go over results make further recommendation.  Chart addendum: Records received from Dr. Arvella Nigh with encounter date 09/04/2019:  She came in for annual exam.  Family history of breast cancer in mother, maternal aunt, maternal grandmother.  Patient's mother did test negative for BRCA mutation.  She also has a paternal grandmother with ovarian cancer.  Patient underwent genetic testing that came back negative.  PMH: perimenopausal versus menopause, migraine headaches, diffuse joint and muscle aches rule out fibromyalgia, family history of both breast and ovarian cancer with negative genetic screening, elevated lifetime risk of breast cancer, mild stress urinary incontinence.  Lifetime risk of breast cancer 40.3%.  She has been advised yearly MRI in addition to mammogram.  She was offered ultrasound Ca-125 which she has declined.  Signs and symptoms of ovarian cancer were discussed. She was sent to [facility]  cc screening. She was taken off birth control pills to check for menopausal state.  [place] was checked and returned st 50.6 -post menopausal.   Plan to send to rheumatology.   Follow-up: Return in about 2 weeks (around 02/27/2020).  This visit occurred during the SARS-CoV-2 public health emergency.  Safety protocols were in place, including screening questions prior to the visit, additional usage of staff PPE, and extensive cleaning of exam room while observing appropriate contact time as indicated for disinfecting solutions.    Denice Paradise, NP

## 2020-02-13 NOTE — Patient Instructions (Addendum)
Return  to the lab today.  Ordered abdominal ultrasound to check for liver.  You should be getting a call from scheduling to set this up.  I placed referral to [facility] for your right shoulder pain with movement.  Follow-up office visit in 2 weeks after the ultrasound and labs to go over results make further recommendation.   Joint Pain Joint pain (arthralgia) may be caused by many things. Joint pain is likely to go away when you follow instructions from your health care provider for relieving pain at home. However, joint pain can also be caused by conditions that require more treatment. Common causes of joint pain include:  Bruising in the area of the joint.  Injury caused by repeating certain movements too many times (overuse injury).  Age-related joint wear and tear (osteoarthritis).  Buildup of uric acid crystals in the joint (gout).  Inflammation of the joint (rheumatic disease).  Various other forms of arthritis.  Infections of the joint (septic arthritis) or of the bone (osteomyelitis). Your health care provider may recommend that you take pain medicine or wear a supportive device like an elastic bandage, sling, or splint. If your joint pain continues, you may need lab or imaging tests to diagnose the cause of your joint pain. Follow these instructions at home: Managing pain, stiffness, and swelling   If directed, put ice on the painful area. Icing can help to relieve joint pain and swelling. ? Put ice in a plastic bag. ? Place a towel between your skin and the bag. ? Leave the ice on for 20 minutes, 2-3 times a day.  If directed, apply heat to the painful area as often as told by your health care provider. Heat can reduce the stiffness of your muscles and joints. Use the heat source that your health care provider recommends, such as a moist heat pack or a heating pad. ? Place a towel between your skin and the heat source. ? Leave the heat on for 20-30  minutes. ? Remove the heat if your skin turns bright red. This is especially important if you are unable to feel pain, heat, or cold. You may have a greater risk of getting burned.  Move your fingers or toes below the painful joint often. You can avoid stiffness and lessen swelling by doing this.  If possible, raise (elevate) the painful joint above the level of your heart while you are sitting or lying down. To do this, try putting a few pillows under the painful joint. Activity  Rest the painful joint for as long as directed. Do not do anything that causes or worsens pain.  Begin exercising or stretching the affected area, as told by your health care provider. Ask your health care provider what types of exercise are safe for you. If you have an elastic bandage, sling, or splint:  Wear the supportive device as told by your health care provider. Remove it only as told by your health care provider.  Loosen the device if your fingers or toes below the joint tingle, become numb, or turn cold and blue.  Keep the device clean.  Ask your health care provider if you should remove the device before bathing. You may need to cover it with a watertight covering when you take a bath or a shower. General instructions  Take over-the-counter and prescription medicines only as told by your health care provider.  Do not use any products that contain nicotine or tobacco, such as cigarettes and e-cigarettes. If you  need help quitting, ask your health care provider.  Keep all follow-up visits as told by your health care provider. This is important. Contact a health care provider if:  You have pain that gets worse and does not get better with medicine.  Your joint pain does not improve within 3 days.  You have increased bruising or swelling.  You have a fever.  You lose 10 lb (4.5 kg) or more without trying. Get help right away if:  You cannot move the joint.  Your fingers or toes tingle,  become numb, or turn cold and blue.  You have a fever along with a joint that is red, warm, and swollen. Summary  Joint pain (arthralgia) may be caused by many things.  Your health care provider may recommend that you take pain medicine or wear a supportive device like an elastic bandage, sling, or splint.  If your joint pain continues, you may need tests to diagnose the cause of your joint pain.  Take over-the-counter and prescription medicines only as told by your health care provider. This information is not intended to replace advice given to you by your health care provider. Make sure you discuss any questions you have with your health care provider. Document Revised: 03/19/2017 Document Reviewed: 01/20/2017 Elsevier Patient Education  2020 [employer].

## 2020-02-14 LAB — COMPREHENSIVE METABOLIC PANEL
ALT: 39 U/L — ABNORMAL HIGH (ref 0–35)
AST: 25 U/L (ref 0–37)
Albumin: 4.6 g/dL (ref 3.5–5.2)
Alkaline Phosphatase: 87 U/L (ref 39–117)
BUN: 16 mg/dL (ref 6–23)
CO2: 31 mEq/L (ref 19–32)
Calcium: 9.9 mg/dL (ref 8.4–10.5)
Chloride: 100 mEq/L (ref 96–112)
Creatinine, Ser: 0.6 mg/dL (ref 0.40–1.20)
GFR: 103.64 mL/min (ref >60.00)
Glucose, Bld: 73 mg/dL (ref 70–99)
Potassium: 4.1 mEq/L (ref 3.5–5.1)
Sodium: 138 mEq/L (ref 135–145)
Total Bilirubin: 0.5 mg/dL (ref 0.2–1.2)
Total Protein: 7.1 g/dL (ref 6.0–8.3)

## 2020-02-14 LAB — CBC WITH DIFFERENTIAL/PLATELET
Basophils Absolute: 0.1 10*3/uL (ref 0.0–0.1)
Basophils Relative: 0.9 % (ref 0.0–3.0)
Eosinophils Absolute: 0.1 10*3/uL (ref 0.0–0.7)
Eosinophils Relative: 1.6 % (ref 0.0–5.0)
HCT: 44.4 % (ref 36.0–46.0)
Hemoglobin: 15 g/dL (ref 12.0–15.0)
Lymphocytes Relative: 31.5 % (ref 12.0–46.0)
Lymphs Abs: 2.2 10*3/uL (ref 0.7–4.0)
MCHC: 33.8 g/dL (ref 30.0–36.0)
MCV: 90.8 fl (ref 78.0–100.0)
Monocytes Absolute: 0.4 10*3/uL (ref 0.1–1.0)
Monocytes Relative: 5.8 % (ref 3.0–12.0)
Neutro Abs: 4.3 10*3/uL (ref 1.4–7.7)
Neutrophils Relative %: 60.2 % (ref 43.0–77.0)
Platelets: 238 10*3/uL (ref 150.0–400.0)
RBC: 4.89 Mil/uL (ref 3.87–5.11)
RDW: 12.9 % (ref 11.5–15.5)
WBC: 7.1 10*3/uL (ref 4.0–10.5)

## 2020-02-14 LAB — TSH: TSH: 1.17 u[IU]/mL (ref 0.35–4.50)

## 2020-02-14 LAB — ANA W/REFLEX IF POSITIVE: Anti Nuclear Antibody (ANA): NEGATIVE

## 2020-02-14 LAB — ACUTE HEP PANEL AND HEP B SURFACE AB
Hep A IgM: NEGATIVE
Hep B C IgM: NEGATIVE
Hep C Virus Ab: 0.1 s/co ratio (ref 0.0–0.9)
Hepatitis B Surf Ab Quant: 5.7 m[IU]/mL — ABNORMAL LOW (ref >9.9)
Hepatitis B Surface Ag: NEGATIVE

## 2020-02-14 LAB — LIPID PANEL
Cholesterol: 179 mg/dL (ref 0–200)
HDL: 73.9 mg/dL (ref >39.00)
LDL Cholesterol: 91 mg/dL (ref 0–99)
NonHDL: 104.61
Total CHOL/HDL Ratio: 2
Triglycerides: 70 mg/dL (ref 0.0–149.0)
VLDL: 14 mg/dL (ref 0.0–40.0)

## 2020-02-14 LAB — HIV ANTIBODY (ROUTINE TESTING W REFLEX): HIV 1&2 Ab, 4th Generation: NONREACTIVE

## 2020-02-14 LAB — IRON,TIBC AND FERRITIN PANEL
Ferritin: 114 ng/mL (ref 15–150)
Iron Saturation: 29 % (ref 15–55)
Iron: 92 ug/dL (ref 27–159)
Total Iron Binding Capacity: 315 ug/dL (ref 250–450)
UIBC: 223 ug/dL (ref 131–425)

## 2020-02-14 LAB — PROTIME-INR
INR: 1
Prothrombin Time: 10.3 s (ref 9.0–11.5)

## 2020-02-14 LAB — HEMOGLOBIN A1C: Hgb A1c MFr Bld: 5.5 % (ref 4.6–6.5)

## 2020-02-14 LAB — ANTI-SMOOTH MUSCLE ANTIBODY, IGG: Smooth Muscle Ab: 8 Units (ref 0–19)

## 2020-02-15 DIAGNOSIS — Z0000 Encounter for general adult medical examination without abnormal findings: Secondary | ICD-10-CM | POA: Insufficient documentation

## 2020-02-15 DIAGNOSIS — R7989 Other specified abnormal findings of blood chemistry: Secondary | ICD-10-CM | POA: Insufficient documentation

## 2020-02-15 DIAGNOSIS — M25511 Pain in right shoulder: Secondary | ICD-10-CM | POA: Insufficient documentation

## 2020-02-20 ENCOUNTER — Ambulatory Visit
Admission: RE | Admit: 2020-02-20 | Discharge: 2020-02-20 | Disposition: A | Discharge status: Home / Self Care | Payer: BC Managed Care – PPO | Source: Ambulatory Visit | Attending: Nurse Practitioner | Admitting: Nurse Practitioner

## 2020-02-20 ENCOUNTER — Other Ambulatory Visit: Payer: Self-pay

## 2020-02-20 DIAGNOSIS — R7989 Other specified abnormal findings of blood chemistry: Secondary | ICD-10-CM | POA: Insufficient documentation

## 2020-02-21 NOTE — Addendum Note (Signed)
Addended by: Amedeo Kinsman A on: 02/21/2020 02:24 PM   Modules accepted: Orders

## 2020-02-29 ENCOUNTER — Ambulatory Visit: Payer: BC Managed Care – PPO | Admitting: Nurse Practitioner

## 2020-02-29 DIAGNOSIS — Z0289 Encounter for other administrative examinations: Secondary | ICD-10-CM

## 2020-03-21 ENCOUNTER — Telehealth: Payer: Self-pay | Admitting: Internal Medicine

## 2020-09-06 ENCOUNTER — Other Ambulatory Visit: Payer: Self-pay | Admitting: Obstetrics and Gynecology

## 2020-09-06 ENCOUNTER — Other Ambulatory Visit (HOSPITAL_BASED_OUTPATIENT_CLINIC_OR_DEPARTMENT_OTHER): Payer: Self-pay | Admitting: Obstetrics and Gynecology

## 2020-09-06 DIAGNOSIS — Z9189 Other specified personal risk factors, not elsewhere classified: Secondary | ICD-10-CM

## 2020-09-06 DIAGNOSIS — Z1231 Encounter for screening mammogram for malignant neoplasm of breast: Secondary | ICD-10-CM

## 2021-01-29 ENCOUNTER — Other Ambulatory Visit: Payer: Self-pay | Admitting: General Surgery

## 2021-01-29 DIAGNOSIS — K824 Cholesterolosis of gallbladder: Secondary | ICD-10-CM

## 2021-03-10 ENCOUNTER — Ambulatory Visit
Admission: RE | Admit: 2021-03-10 | Discharge: 2021-03-10 | Disposition: A | Discharge status: Home / Self Care | Payer: BC Managed Care – PPO | Source: Ambulatory Visit | Attending: General Surgery | Admitting: General Surgery

## 2021-03-10 DIAGNOSIS — K824 Cholesterolosis of gallbladder: Secondary | ICD-10-CM | POA: Diagnosis not present

## 2022-02-17 IMAGING — US US ABDOMEN COMPLETE
1 series · 14 of 25 positions shown · non-contrast
Comparison: None.

CLINICAL DATA: Elevated ALT

EXAM:
ABDOMEN ULTRASOUND COMPLETE

[Series 1: us abdomen complete · 14 of 101 slices shown]
[im 1/101]
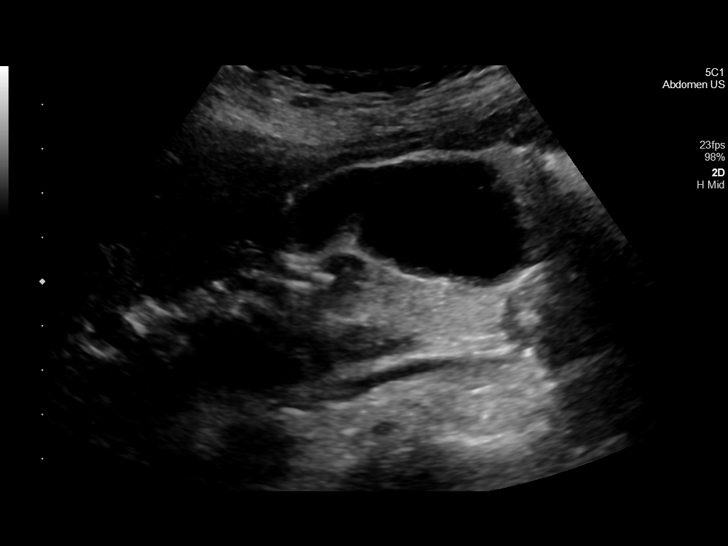
[im 9/101]
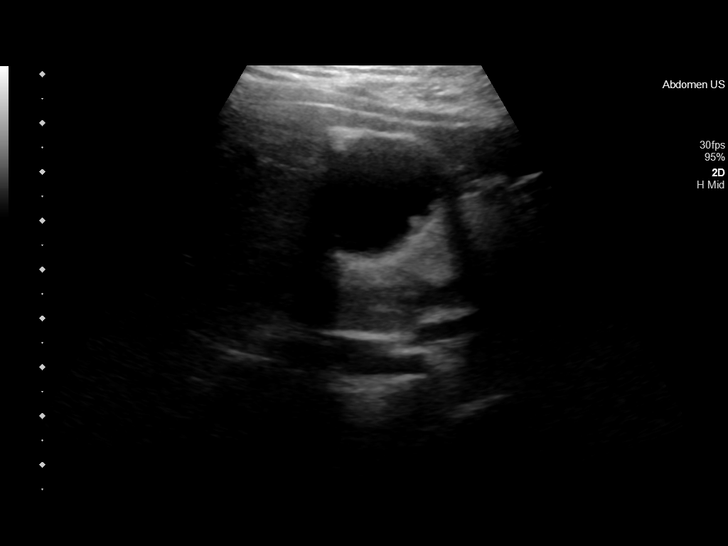
[im 17/101]
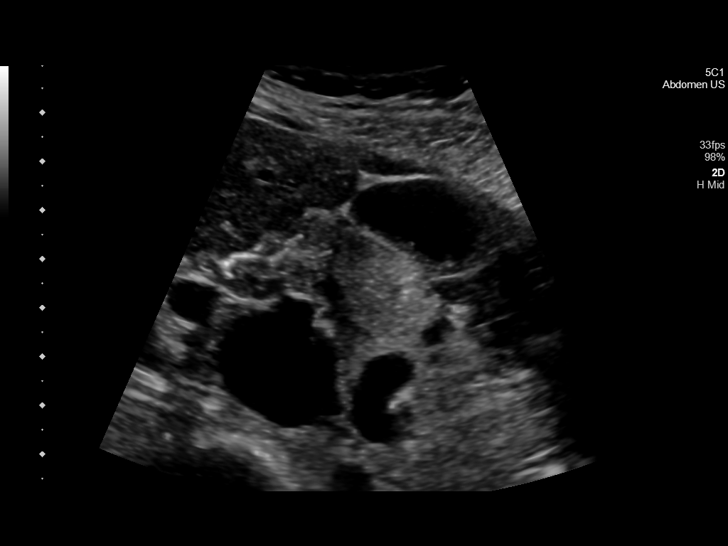
[im 26/101]
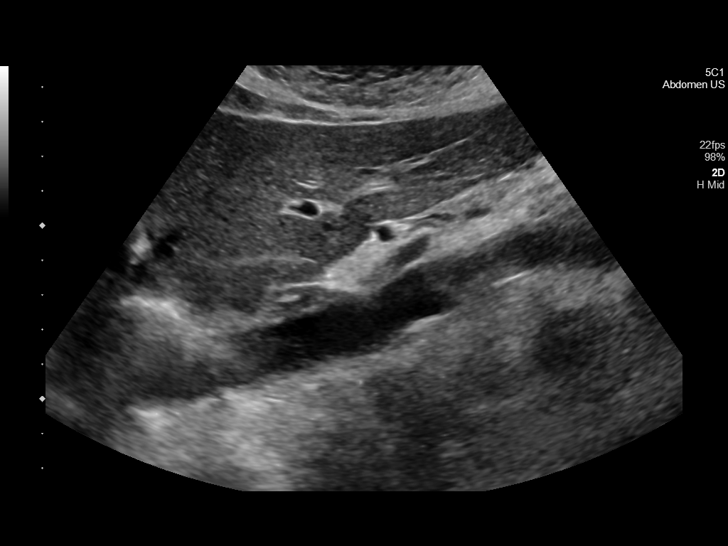
[im 34/101]
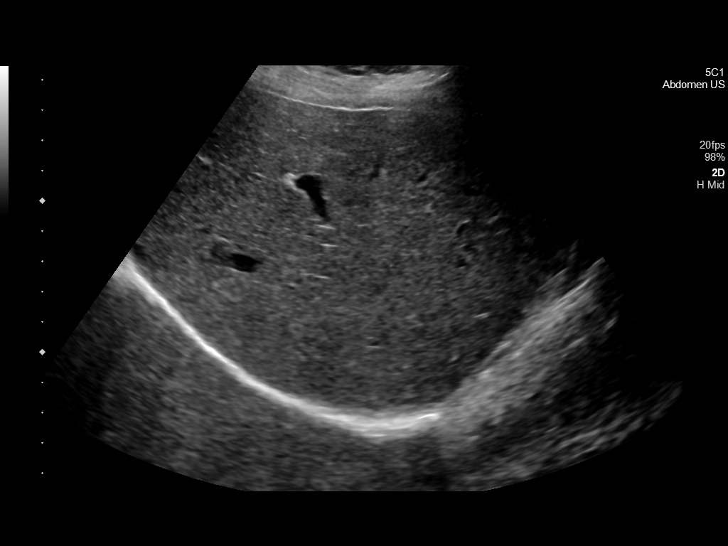
[im 38/101]
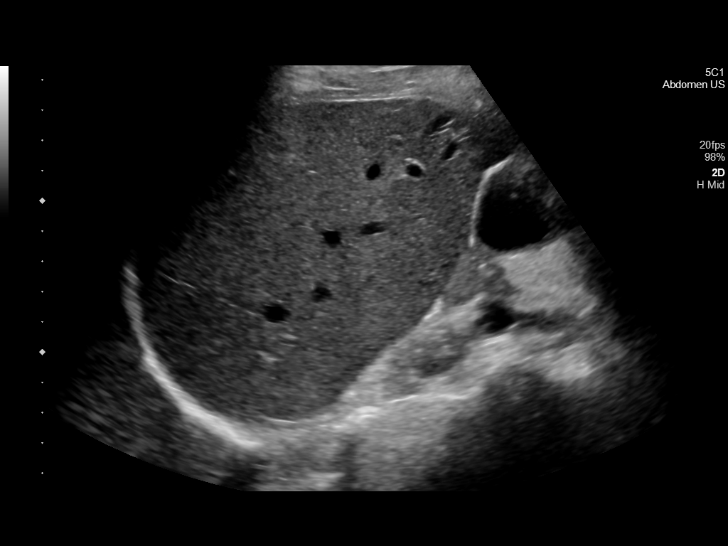
[im 46/101]
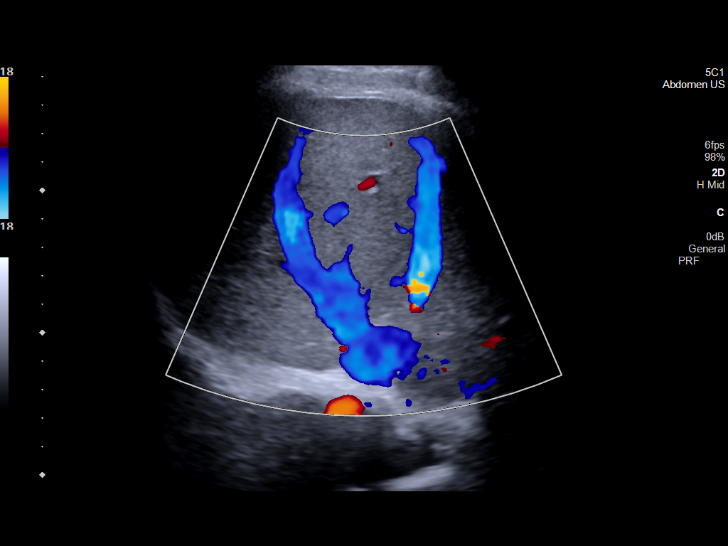
[im 55/101]
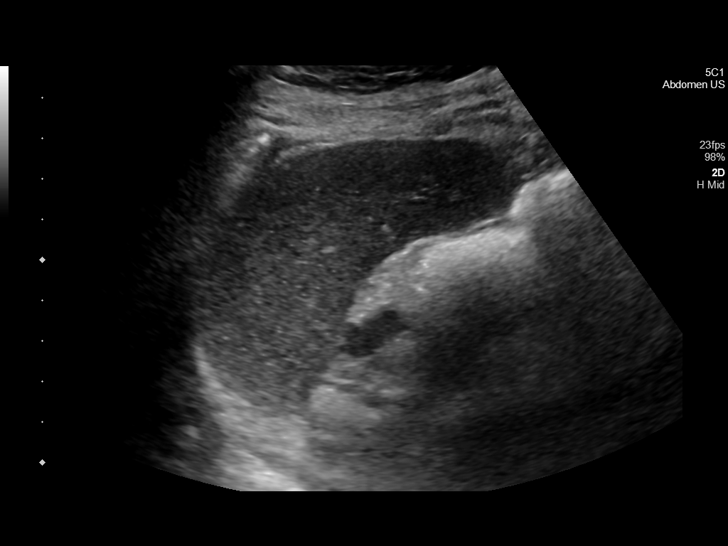
[im 63/101]
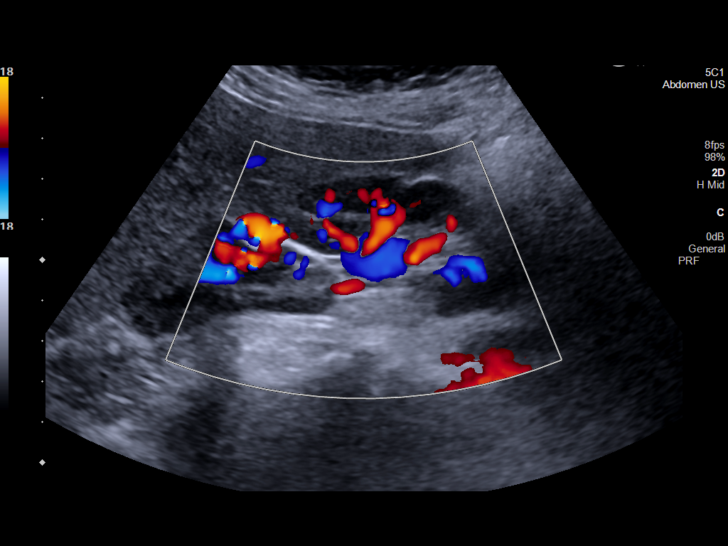
[im 67/101]
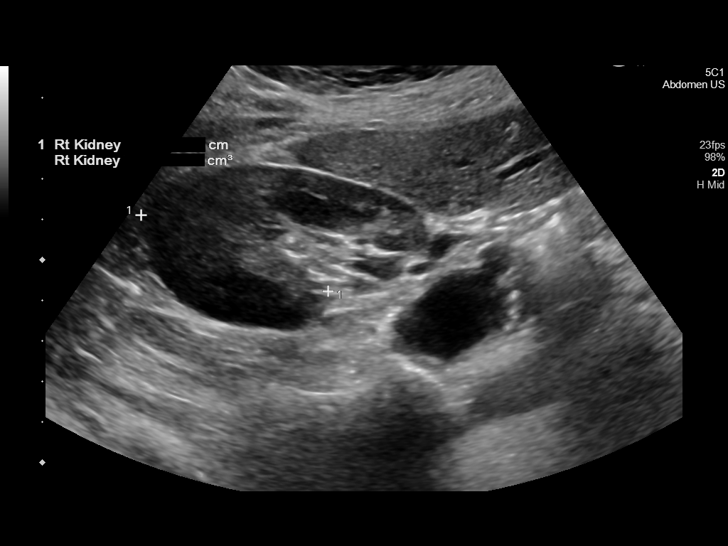
[im 76/101]
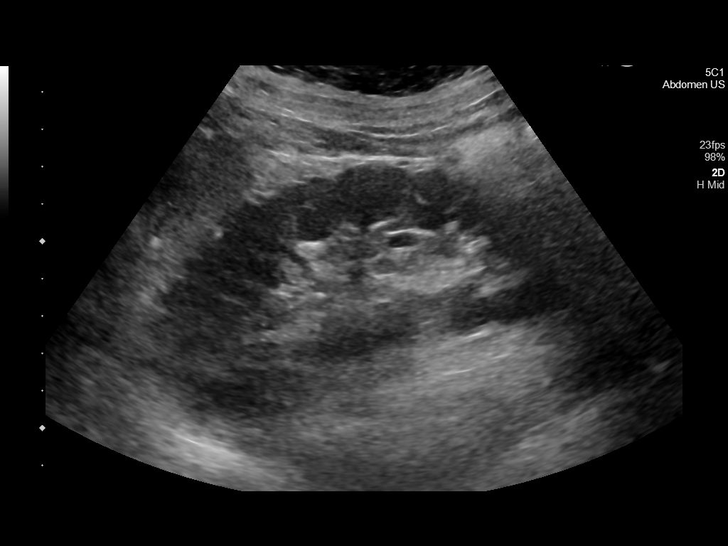
[im 84/101]
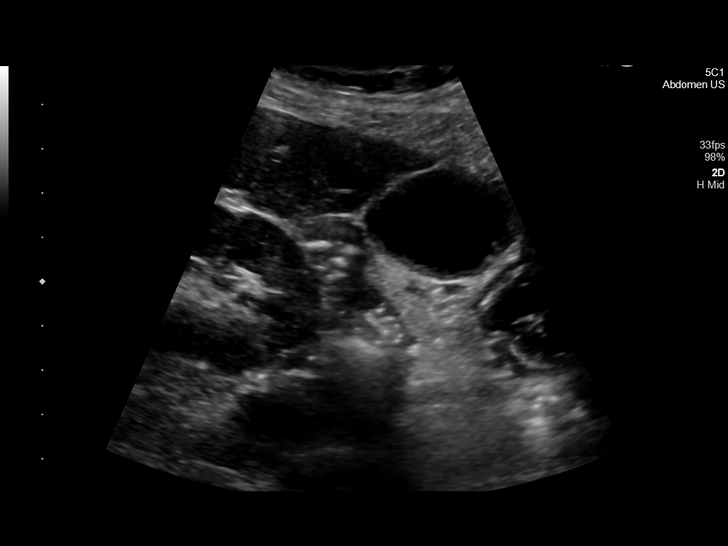
[im 92/101]
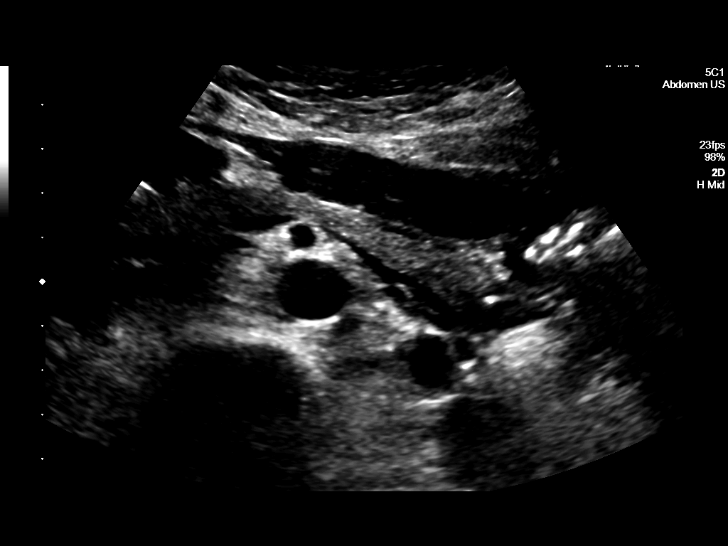
[im 101/101]
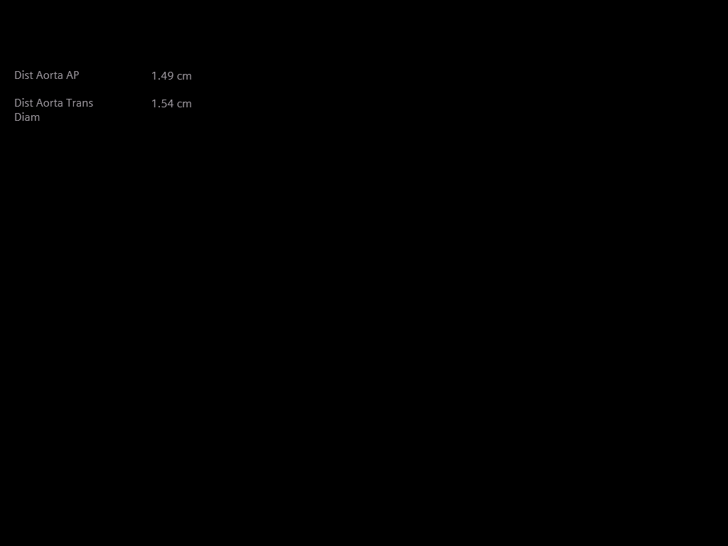

[14 of 25 positions shown; findings below may reference images not displayed]

FINDINGS: Gallbladder: Well distended with multiple non mobile echogenic foci
consistent with small gallbladder polyps. No wall thickening or
pericholecystic fluid is noted.

Common bile duct: Diameter: 3 mm

Liver: No focal lesion identified. Within normal limits in
parenchymal echogenicity. Portal vein is patent on color Doppler
imaging with normal direction of blood flow towards the liver.

IVC: No abnormality visualized.

Pancreas: Visualized portion unremarkable.

Spleen: Size and appearance within normal limits.

Right Kidney: Length: 11.5 cm. Echogenicity within normal limits. No
mass or hydronephrosis visualized.

Left Kidney: Length: 12.3 cm. Echogenicity within normal limits. No
mass or hydronephrosis visualized.

Abdominal aorta: No aneurysm visualized.

Other findings: None.
IMPRESSION: Multiple small gallbladder polyps.

No other focal abnormality is noted.

## 2022-04-01 NOTE — Telephone Encounter (Signed)
MyChart messgae sent to patient. 

## 2022-09-16 ENCOUNTER — Other Ambulatory Visit: Payer: Self-pay | Admitting: Obstetrics and Gynecology

## 2022-09-16 DIAGNOSIS — Z803 Family history of malignant neoplasm of breast: Secondary | ICD-10-CM

## 2022-10-29 ENCOUNTER — Other Ambulatory Visit: Payer: Self-pay | Admitting: Obstetrics and Gynecology

## 2022-10-29 DIAGNOSIS — Z803 Family history of malignant neoplasm of breast: Secondary | ICD-10-CM

## 2022-10-30 ENCOUNTER — Other Ambulatory Visit: Payer: BC Managed Care – PPO

## 2022-11-12 ENCOUNTER — Ambulatory Visit: Payer: BC Managed Care – PPO

## 2022-12-07 ENCOUNTER — Encounter: Payer: Self-pay | Admitting: Obstetrics and Gynecology

## 2022-12-10 ENCOUNTER — Ambulatory Visit: Payer: BC Managed Care – PPO

## 2023-03-08 IMAGING — US US ABDOMEN LIMITED
1 series · 15 of 25 positions shown · non-contrast
Comparison: Abdominal ultrasound, 02/20/2020.

CLINICAL DATA: Gallbladder polyp.

EXAM:
ULTRASOUND ABDOMEN LIMITED RIGHT UPPER QUADRANT

[Series 1: us abdomen limited ruq · 15 of 82 slices shown]
[im 1/82]
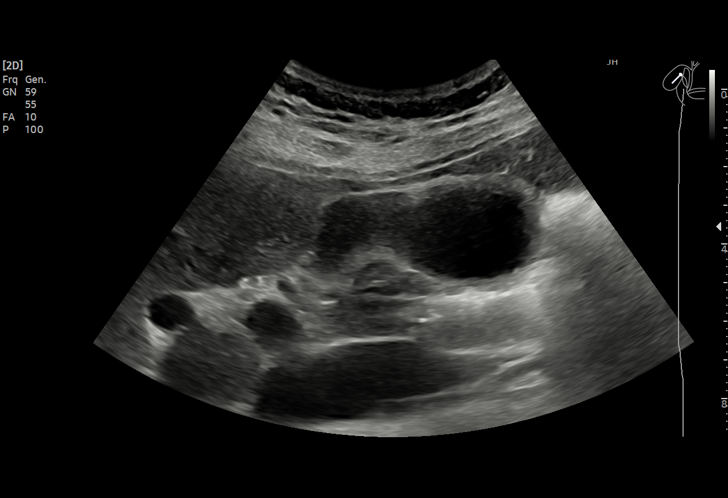
[im 7/82]
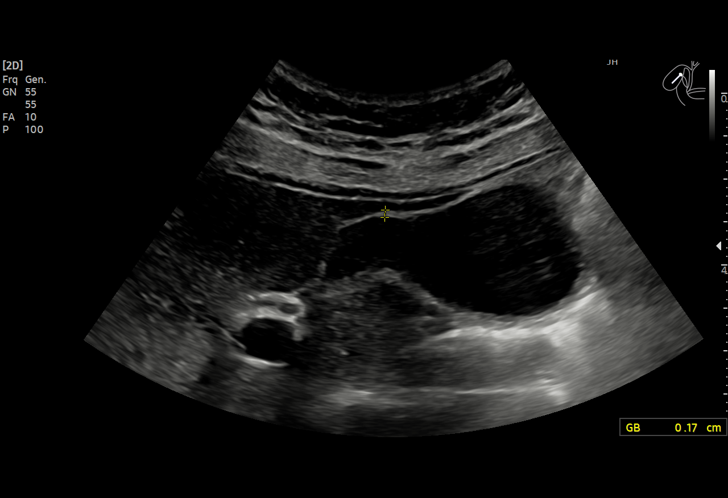
[im 14/82]
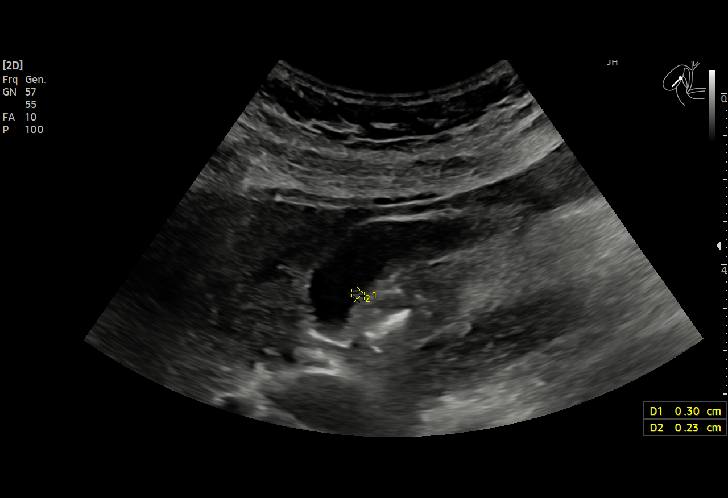
[im 17/82]
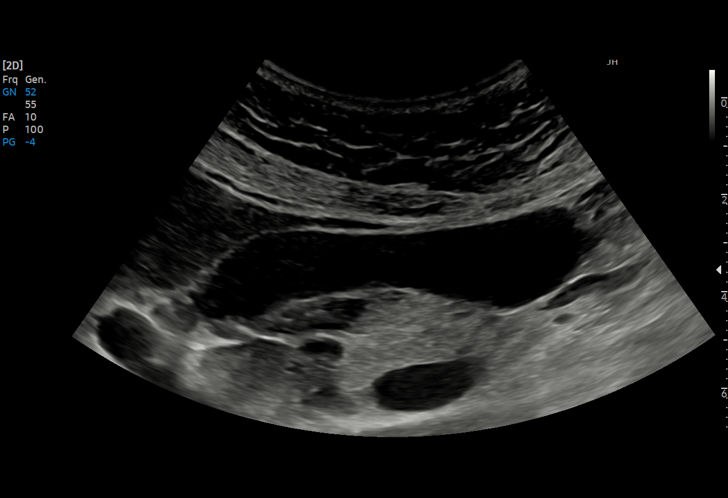
[im 24/82]
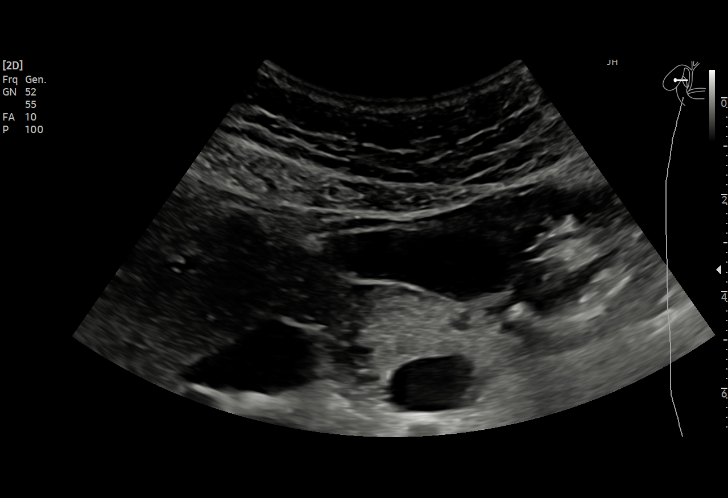
[im 31/82]
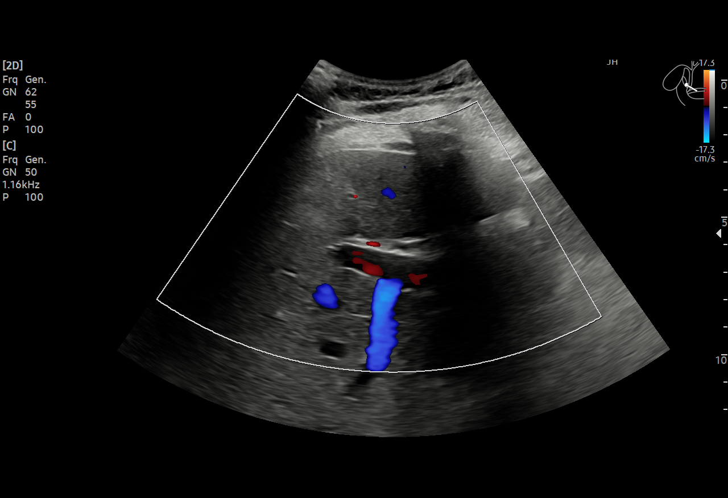
[im 34/82]
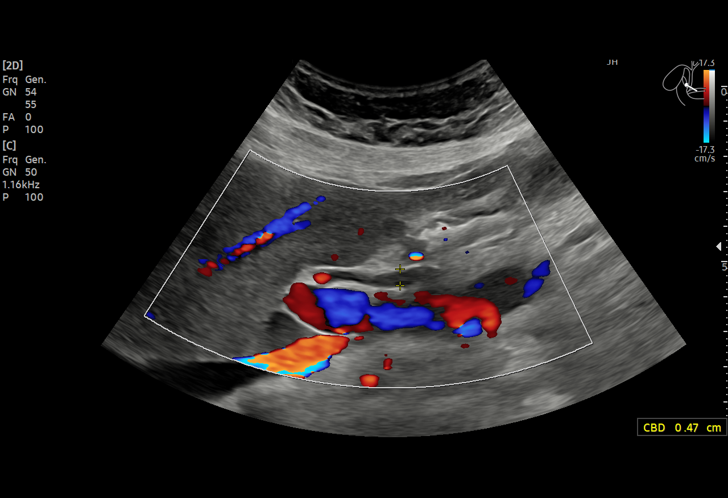
[im 41/82]
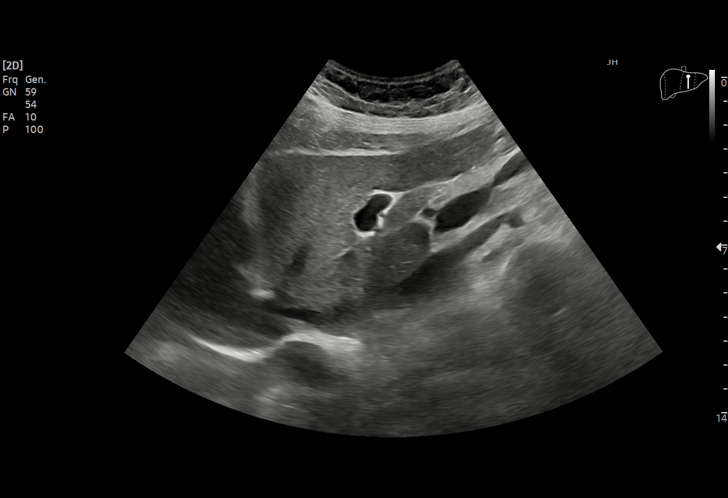
[im 48/82]
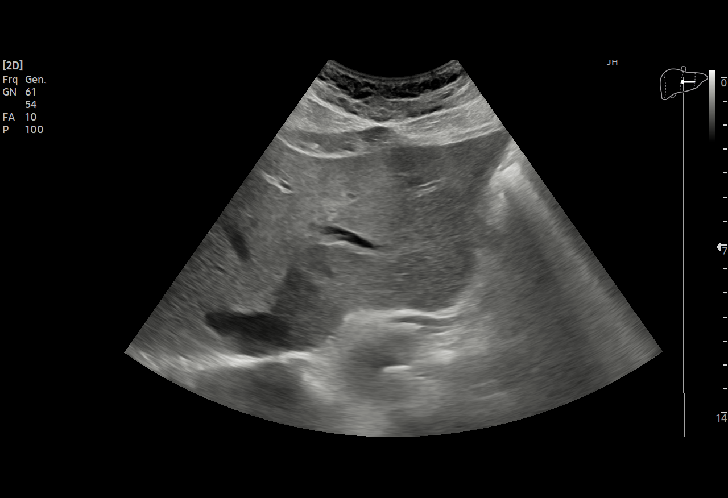
[im 51/82]
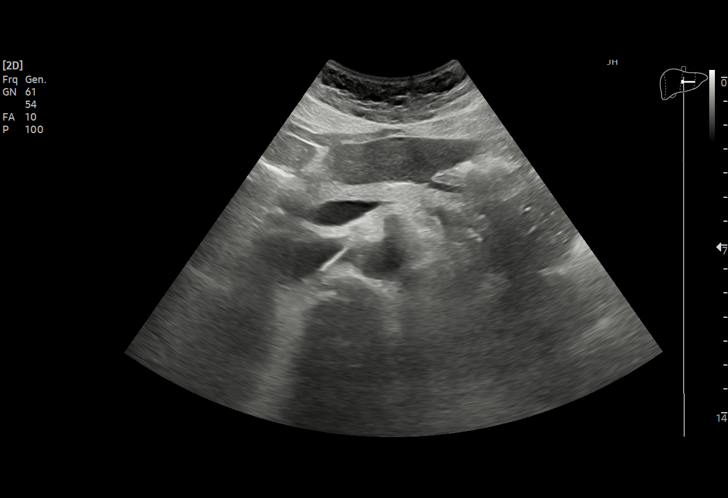
[im 58/82]
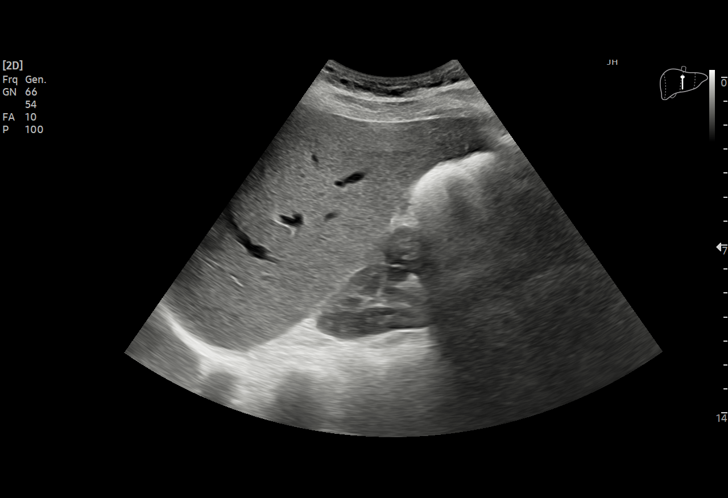
[im 65/82]
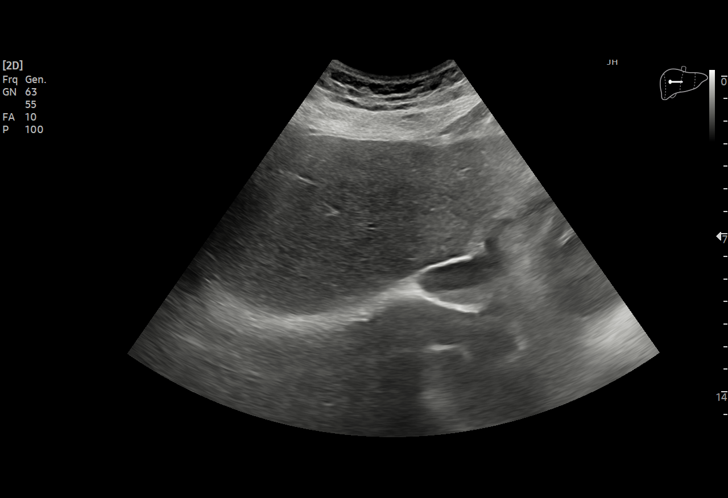
[im 68/82]
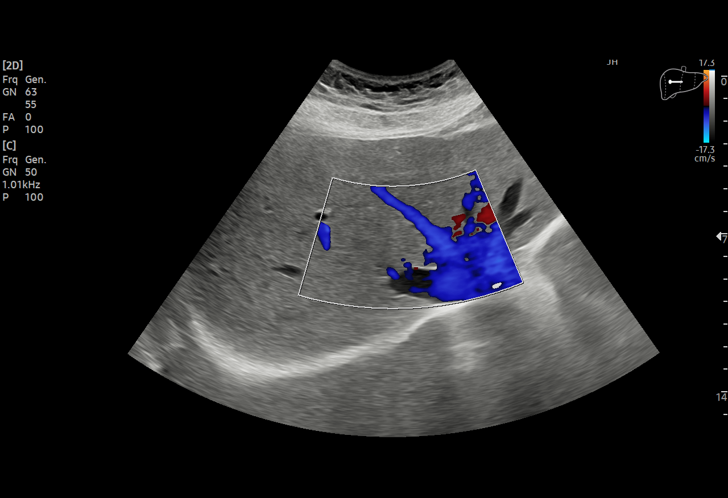
[im 75/82]
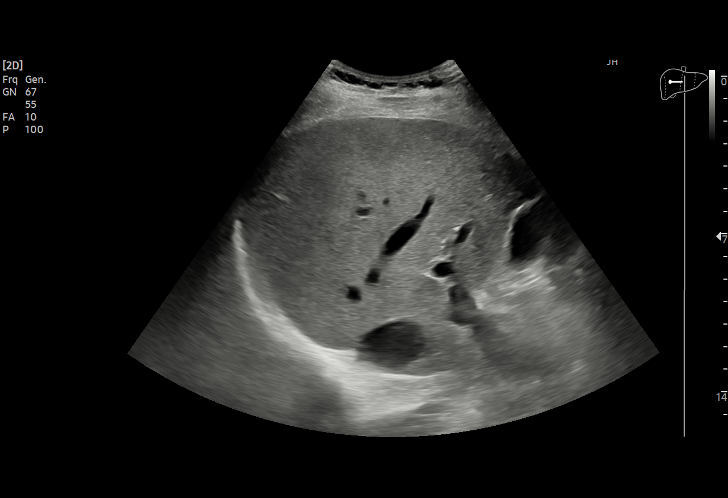
[im 82/82]
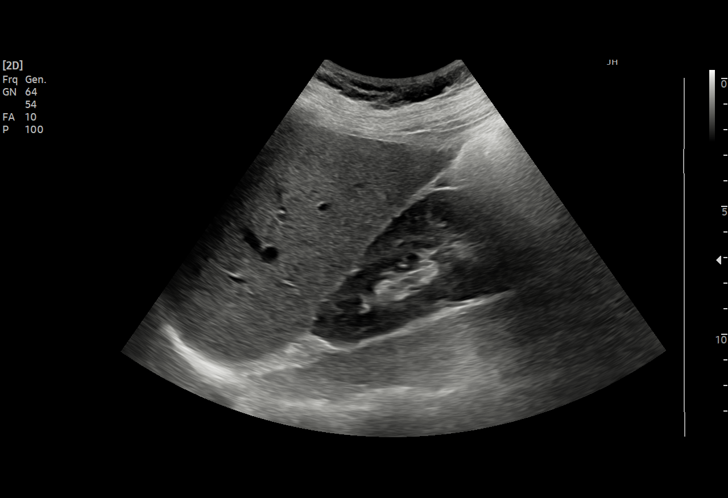

[15 of 25 positions shown; findings below may reference images not displayed]

FINDINGS: Gallbladder:

No gallstones or wall thickening visualized. Nondistended, with
multiple nonmobile gallbladder wall foci, consistent with polyps.
Largest lesion measuring up to 0.3 cm in greatest dimension. See key
image.

No sonographic Murphy sign noted by sonographer.

Common bile duct:

Diameter: 0.5 cm

Liver:

No focal lesion identified. Increased hepatic in parenchymal
echogenicity. Portal vein is patent on color Doppler imaging with
normal direction of blood flow towards the liver.

Other: No ascites.
IMPRESSION: 1. Gallbladder polyps, largest lesion measuring up to 0.3 cm. No
follow-up recommended given size, per society of Radiologists in
Ultrasound (SRU) guidelines 9493.
2. Echogenic liver. Findings commonly seen in hepatic steatosis,
though may also represent hepatitis and/or fibrosis.

## 2023-04-28 ENCOUNTER — Encounter: Payer: Self-pay | Admitting: Obstetrics and Gynecology

## 2023-05-03 ENCOUNTER — Ambulatory Visit
Admission: RE | Admit: 2023-05-03 | Discharge: 2023-05-03 | Disposition: A | Discharge status: Home / Self Care | Payer: 59 | Source: Ambulatory Visit | Attending: Obstetrics and Gynecology | Admitting: Obstetrics and Gynecology

## 2023-05-03 DIAGNOSIS — Z803 Family history of malignant neoplasm of breast: Secondary | ICD-10-CM

## 2023-05-03 MED ORDER — GADOPICLENOL 0.5 MMOL/ML IV SOLN
7.0000 mL | Freq: Once | INTRAVENOUS | Status: AC | PRN
  Administered 2023-05-03: 7 mL via INTRAVENOUS

## 2023-09-23 ENCOUNTER — Ambulatory Visit (INDEPENDENT_AMBULATORY_CARE_PROVIDER_SITE_OTHER)

## 2023-09-23 ENCOUNTER — Ambulatory Visit: Admitting: Podiatry

## 2023-09-23 DIAGNOSIS — M79671 Pain in right foot: Secondary | ICD-10-CM | POA: Diagnosis not present

## 2023-09-23 DIAGNOSIS — M216X9 Other acquired deformities of unspecified foot: Secondary | ICD-10-CM | POA: Diagnosis not present

## 2023-09-23 DIAGNOSIS — M722 Plantar fascial fibromatosis: Secondary | ICD-10-CM | POA: Diagnosis not present

## 2023-09-23 MED ORDER — TRIAMCINOLONE ACETONIDE 10 MG/ML IJ SUSP: 10.0000 mg | Freq: Once | INTRAMUSCULAR | Status: AC

## 2023-09-23 MED ORDER — MELOXICAM 15 MG PO TABS: 15.0000 mg | ORAL_TABLET | Freq: Every day | ORAL | 0 refills | Status: DC

## 2023-09-23 NOTE — Progress Notes (Signed)
 Chief Complaint  Patient presents with   Foot Pain    Bilateral heel pain. Pain is primarily in the heel. Worse in the right, especially when she wakes up in the morning. Improves with walking. This has been ongoing for a couple of months.     HPI: 53 y.o. female presenting today with c/o pain in the bottom of the bilateral heel.  Pain is gotten quite severe.  This started with the right foot several months ago, the left has become painful as well.  She states that the right one is worse.  Pain is worse after periods of rest and during first couple steps in the morning.  She does also report some referred pain laterally and some neuritis like symptoms to the plantar and posterior heel right side.  Past Medical History:  Diagnosis Date   Allergy    Elevated liver enzymes 01/2020   Frequent headaches    Inflammatory polyps of colon (HCC)    Migraines    Past Surgical History:  Procedure Laterality Date   nevi     left thoracic mole removal severly atypical    No Known Allergies   Physical Exam: General: The patient is alert and oriented x3 in no acute distress.  Dermatology:  No ecchymosis, erythema, or edema bilateral.  No open lesions.    Vascular: Palpable pedal pulses bilaterally. Capillary refill within normal limits.  No appreciable edema.    Neurological: Epicritic sensation is intact, negative Tinel's sign bilaterally  Musculoskeletal Exam:  There is pain on palpation of the plantarmedial & plantarcentral aspect of bilateral heel.  No gaps or nodules within the plantar fascia.  Positive Windlass mechanism bilateral.  Antalgic gait noted with first few steps upon standing.  No pain on palpation of achilles tendon bilateral.  Ankle df less than 10 degrees with knee extended b/l.  Right heel does have some pain with calcaneal squeeze.  Some subjective neuritis symptoms posterior plantar aspect and some referred pain to lateral hindfoot. Cavus foot type  bilaterally.  Radiographic Exam: Right foot 3 views weightbearing 09/23/2023 Joint spaces preserved.  No fractures noted. Bipartite sesamoid. Cavus foot type. Area of slight hypodensity within calcaneal neurtral triangle.  Assessment/Plan of Care: 1. Bilateral plantar fasciitis   2. Pain of right heel     Meds ordered this encounter  Medications   triamcinolone  acetonide (KENALOG ) 10 MG/ML injection 10 mg   meloxicam  (MOBIC ) 15 MG tablet    Sig: Take 1 tablet (15 mg total) by mouth daily.    Dispense:  30 tablet    Refill:  0   None  -Reviewed etiology of plantar fasciitis with patient.  Radiographs viewed with patient.  Discussed treatment options with patient today, including cortisone injection, NSAID course of treatment, stretching exercises, physical therapy, use of night splint, rest, icing the heel, arch supports/orthotics, and supportive shoe gear.    With the patient's verbal consent, a corticosteroid injection was administered to the bilateral heel, consisting of a mixture of 1% lidocaine plain, 0.5% Sensorcaine plain, and Kenalog -10 for a total of 1.5cc administered to each heel.  A Band-aid was applied.  Patient tolerated injections well  Given severity to the right foot with increased chronicity, will start with cam boot immobilization.  Plantar fascia brace dispensed for the left foot to be worn with ambulatory activity.  These are ankle gauntlet devices with elastic strap to support the heel and medial arch.  Stretching exercises discussed at length with patient with written instructions  provided  Powerstep inserts discussed with patient, she may look into these over-the-counter, holding off for now due to cam boot immobilization. This will provide benefit for the patient as well due to high arch foot type.  Prescribing course of oral meloxicam .  Return in about 2 weeks (around 10/07/2023) for Plantar Fasciitis.   Demica Zook L. Lunda Salines, AACFAS [facility]     [address] [place], [place] [address]                Office [phone]  Fax [phone]

## 2023-09-23 NOTE — Patient Instructions (Signed)

## 2023-10-08 ENCOUNTER — Ambulatory Visit: Admitting: Podiatry

## 2023-10-21 ENCOUNTER — Other Ambulatory Visit: Payer: Self-pay | Admitting: Podiatry

## 2023-10-21 DIAGNOSIS — M722 Plantar fascial fibromatosis: Secondary | ICD-10-CM

## 2023-11-26 ENCOUNTER — Other Ambulatory Visit: Payer: Self-pay | Admitting: Podiatry

## 2023-11-26 DIAGNOSIS — M722 Plantar fascial fibromatosis: Secondary | ICD-10-CM

## 2024-02-24 ENCOUNTER — Ambulatory Visit: Admitting: Podiatry

## 2024-02-24 DIAGNOSIS — M21962 Unspecified acquired deformity of left lower leg: Secondary | ICD-10-CM | POA: Diagnosis not present

## 2024-02-24 DIAGNOSIS — M19071 Primary osteoarthritis, right ankle and foot: Secondary | ICD-10-CM | POA: Diagnosis not present

## 2024-02-24 DIAGNOSIS — M21961 Unspecified acquired deformity of right lower leg: Secondary | ICD-10-CM | POA: Diagnosis not present

## 2024-02-24 NOTE — Progress Notes (Signed)
 Subjective:  Patient ID: Hailey Williams, female    DOB: 22-Jul-1970,  MRN: [id number]  Chief Complaint  Patient presents with   Foot Pain    Pt stated that she has been having a lot of right foot pain and swelling no recent injuries     53 y.o. female presents with the above complaint.  Patient presents with right dorsal midfoot capsulitis with underlying arthritis.  Patient states painful touch is progressive gotten worse worse with ambulation or shoe pressure of the right foot is the worst when there is swelling no recent injury.  She also would like to discuss orthotics option as well.  She wears regular shoes.   Review of Systems: Negative except as noted in the HPI. Denies N/V/F/Ch.  Past Medical History:  Diagnosis Date   Allergy    Elevated liver enzymes 01/2020   Frequent headaches    Inflammatory polyps of colon (HCC)    Migraines     Current Outpatient Medications:    Cholecalciferol (VITAMIN D-3 PO), , Disp: , Rfl:    eletriptan (RELPAX) 40 MG tablet, , Disp: , Rfl:    estradiol (CLIMARA - DOSED IN MG/24 HR) 0.05 mg/24hr patch, Place 0.05 mg onto the skin once a week., Disp: , Rfl:    meloxicam  (MOBIC ) 15 MG tablet, TAKE 1 TABLET (15 MG TOTAL) BY MOUTH DAILY., Disp: 30 tablet, Rfl: 0   progesterone (PROMETRIUM) 100 MG capsule, Take 100 mg by mouth daily., Disp: , Rfl:   Current Facility-Administered Medications:    triamcinolone  acetonide (KENALOG ) 10 MG/ML injection 10 mg, 10 mg, Other, Once,   Social History   Tobacco Use  Smoking Status Never  Smokeless Tobacco Never    No Known Allergies Objective:  There were no vitals filed for this visit. There is no height or weight on file to calculate BMI. Constitutional Well developed. Well nourished.  Vascular Dorsalis pedis pulses palpable bilaterally. Posterior tibial pulses palpable bilaterally. Capillary refill normal to all digits.  No cyanosis or clubbing noted. Pedal hair growth normal.  Neurologic  Normal speech. Oriented to person, place, and time. Epicritic sensation to light touch grossly present bilaterally.  Dermatologic Nails well groomed and normal in appearance. No open wounds. No skin lesions.  Orthopedic: Pes cavus foot structure noted with dorsal dorsal midfoot arthritis clinically appreciated.  No pain along the course of the extensor tendon.  Lisfranc interval is intact.   Radiographs: None Assessment:   1. Arthritis of right midfoot   2. Foot deformity, bilateral    Plan:  Patient was evaluated and treated and all questions answered.  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  Patient agrees with plan of to proceed with steroid injection -A steroid injection was performed at right dorsal midfoot using 1% plain Lidocaine and 10 mg of Kenalog . This was well tolerated.  Pes cavus/foot deformity -I explained to patient the etiology of pes cavus and relationship with heel pain/arch pain and various treatment options were discussed.  Given patient foot structure in the setting of heel pain/arch pain I believe patient will benefit from custom-made orthotics to help control the hindfoot motion support the arch of the foot and take the stress away from arches.  Patient agrees with the plan like to proceed  with orthotics -Patient was casted for orthotics    No follow-ups on file.
